# Patient Record
Sex: Male | Born: 1985 | Hispanic: Yes | Marital: Married | Smoking: Never smoker
Health system: Southern US, Community
[De-identification: ages and names within clinical notes are randomized; demographics above are authoritative.]

## PROBLEM LIST (undated history)

## (undated) DIAGNOSIS — K219 Gastro-esophageal reflux disease without esophagitis: Secondary | ICD-10-CM

## (undated) DIAGNOSIS — R748 Abnormal levels of other serum enzymes: Secondary | ICD-10-CM

## (undated) HISTORY — PX: APPENDECTOMY: SHX54

---

## 2008-05-23 ENCOUNTER — Inpatient Hospital Stay: Payer: Self-pay | Admitting: Vascular Surgery

## 2010-06-09 ENCOUNTER — Ambulatory Visit: Payer: Self-pay | Admitting: Internal Medicine

## 2011-01-07 ENCOUNTER — Ambulatory Visit: Payer: Self-pay | Admitting: Internal Medicine

## 2012-03-31 ENCOUNTER — Emergency Department: Payer: Self-pay | Admitting: Emergency Medicine

## 2015-01-04 ENCOUNTER — Emergency Department: Payer: BLUE CROSS/BLUE SHIELD

## 2015-01-04 ENCOUNTER — Emergency Department
Admission: EM | Admit: 2015-01-04 | Discharge: 2015-01-04 | Disposition: A | Discharge status: Home / Self Care | Payer: BLUE CROSS/BLUE SHIELD | Attending: Emergency Medicine | Admitting: Emergency Medicine

## 2015-01-04 ENCOUNTER — Encounter: Payer: Self-pay | Admitting: Emergency Medicine

## 2015-01-04 DIAGNOSIS — Y998 Other external cause status: Secondary | ICD-10-CM | POA: Insufficient documentation

## 2015-01-04 DIAGNOSIS — R4020 Unspecified coma: Secondary | ICD-10-CM | POA: Diagnosis present

## 2015-01-04 DIAGNOSIS — Y9389 Activity, other specified: Secondary | ICD-10-CM | POA: Diagnosis not present

## 2015-01-04 DIAGNOSIS — Y92096 Garden or yard of other non-institutional residence as the place of occurrence of the external cause: Secondary | ICD-10-CM | POA: Insufficient documentation

## 2015-01-04 DIAGNOSIS — S0990XA Unspecified injury of head, initial encounter: Secondary | ICD-10-CM | POA: Insufficient documentation

## 2015-01-04 DIAGNOSIS — M546 Pain in thoracic spine: Secondary | ICD-10-CM

## 2015-01-04 DIAGNOSIS — S29092A Other injury of muscle and tendon of back wall of thorax, initial encounter: Secondary | ICD-10-CM | POA: Diagnosis not present

## 2015-01-04 DIAGNOSIS — W19XXXA Unspecified fall, initial encounter: Secondary | ICD-10-CM

## 2015-01-04 DIAGNOSIS — F10120 Alcohol abuse with intoxication, uncomplicated: Secondary | ICD-10-CM | POA: Insufficient documentation

## 2015-01-04 DIAGNOSIS — W1839XA Other fall on same level, initial encounter: Secondary | ICD-10-CM | POA: Diagnosis not present

## 2015-01-04 DIAGNOSIS — F10920 Alcohol use, unspecified with intoxication, uncomplicated: Secondary | ICD-10-CM

## 2015-01-04 LAB — CBC WITH DIFFERENTIAL/PLATELET
BASOS ABS: 0 10*3/uL (ref 0–0.1)
EOS ABS: 3 10*3/uL — AB (ref 0–0.7)
Eosinophils Relative: 28 %
HEMATOCRIT: 48.2 % (ref 40.0–52.0)
Hemoglobin: 16.8 g/dL (ref 13.0–18.0)
Lymphocytes Relative: 27 %
Lymphs Abs: 2.9 10*3/uL (ref 1.0–3.6)
MCH: 29 pg (ref 26.0–34.0)
MCHC: 34.8 g/dL (ref 32.0–36.0)
MCV: 83.5 fL (ref 80.0–100.0)
MONO ABS: 0.5 10*3/uL (ref 0.2–1.0)
NEUTROS ABS: 4.4 10*3/uL (ref 1.4–6.5)
Neutrophils Relative %: 41 %
Platelets: 205 10*3/uL (ref 150–440)
RBC: 5.78 MIL/uL (ref 4.40–5.90)
RDW: 14.2 % (ref 11.5–14.5)
WBC: 10.9 10*3/uL — ABNORMAL HIGH (ref 3.8–10.6)

## 2015-01-04 LAB — COMPREHENSIVE METABOLIC PANEL
ALK PHOS: 69 U/L (ref 38–126)
ALT: 50 U/L (ref 17–63)
ANION GAP: 11 (ref 5–15)
AST: 27 U/L (ref 15–41)
Albumin: 4.8 g/dL (ref 3.5–5.0)
BUN: 15 mg/dL (ref 6–20)
CHLORIDE: 108 mmol/L (ref 101–111)
CO2: 23 mmol/L (ref 22–32)
CREATININE: 0.91 mg/dL (ref 0.61–1.24)
Calcium: 9.4 mg/dL (ref 8.9–10.3)
GFR calc Af Amer: >60 mL/min (ref >60)
Glucose, Bld: 116 mg/dL — ABNORMAL HIGH (ref 65–99)
Potassium: 3.6 mmol/L (ref 3.5–5.1)
Sodium: 142 mmol/L (ref 135–145)
Total Bilirubin: 0.7 mg/dL (ref 0.3–1.2)
Total Protein: 8.2 g/dL — ABNORMAL HIGH (ref 6.5–8.1)

## 2015-01-04 LAB — URINE DRUG SCREEN, QUALITATIVE (ARMC ONLY)
Amphetamines, Ur Screen: NOT DETECTED
BARBITURATES, UR SCREEN: NOT DETECTED
BENZODIAZEPINE, UR SCRN: NOT DETECTED
COCAINE METABOLITE, UR ~~LOC~~: NOT DETECTED
Cannabinoid 50 Ng, Ur ~~LOC~~: NOT DETECTED
MDMA (ECSTASY) UR SCREEN: NOT DETECTED
Methadone Scn, Ur: NOT DETECTED
Opiate, Ur Screen: NOT DETECTED
PHENCYCLIDINE (PCP) UR S: NOT DETECTED
TRICYCLIC, UR SCREEN: NOT DETECTED

## 2015-01-04 LAB — SALICYLATE LEVEL

## 2015-01-04 LAB — ETHANOL: Alcohol, Ethyl (B): 113 mg/dL — ABNORMAL HIGH (ref <5)

## 2015-01-04 LAB — ACETAMINOPHEN LEVEL: Acetaminophen (Tylenol), Serum: <10 ug/mL — ABNORMAL LOW (ref 10–30)

## 2015-01-04 MED ORDER — ONDANSETRON HCL 4 MG/2ML IJ SOLN
INTRAMUSCULAR | Status: AC
  Administered 2015-01-04: 4 mg via INTRAVENOUS
  Filled 2015-01-04: qty 2

## 2015-01-04 MED ORDER — ONDANSETRON HCL 4 MG PO TABS: 4.0000 mg | ORAL_TABLET | Freq: Three times a day (TID) | ORAL | Status: DC | PRN

## 2015-01-04 MED ORDER — ONDANSETRON HCL 4 MG/2ML IJ SOLN
4.0000 mg | Freq: Once | INTRAMUSCULAR | Status: AC
  Administered 2015-01-04: 4 mg via INTRAVENOUS

## 2015-01-04 MED ORDER — HYDROCODONE-ACETAMINOPHEN 5-325 MG PO TABS: 1.0000 | ORAL_TABLET | Freq: Four times a day (QID) | ORAL | Status: DC | PRN

## 2015-01-04 MED ORDER — SODIUM CHLORIDE 0.9 % IV BOLUS (SEPSIS)
1000.0000 mL | Freq: Once | INTRAVENOUS | Status: AC
  Administered 2015-01-04: 1000 mL via INTRAVENOUS

## 2015-01-04 MED ORDER — MORPHINE SULFATE 2 MG/ML IJ SOLN
INTRAMUSCULAR | Status: AC
  Administered 2015-01-04: 2 mg via INTRAVENOUS
  Filled 2015-01-04: qty 1

## 2015-01-04 MED ORDER — MORPHINE SULFATE 2 MG/ML IJ SOLN
2.0000 mg | Freq: Once | INTRAMUSCULAR | Status: AC
  Administered 2015-01-04: 2 mg via INTRAVENOUS

## 2015-01-04 MED ORDER — IBUPROFEN 800 MG PO TABS: 800.0000 mg | ORAL_TABLET | Freq: Three times a day (TID) | ORAL | Status: DC | PRN

## 2015-01-04 NOTE — ED Provider Notes (Signed)
[facility] Emergency Department Provider Note  ____________________________________________  Time seen: Approximately 3:42 AM  I have reviewed the triage vital signs and the nursing notes.   HISTORY  Chief Complaint Unresponsive  History limited by patient unresponsive  HPI Andre Ferrell is a 29 y.o. male who presents to the ED via EMS for reported EtOH intoxication and fall. Family states he was drinking heavily tonight; patient was found outside in the yard unresponsive. Family thinks he had been out there for approximately 40 minutes. Patient arrives in c-collar and on long backboard.Further history unable to be obtained from patient.   History reviewed. No pertinent past medical history.  There are no active problems to display for this patient.   Past Surgical History  Procedure Laterality Date  . Appendectomy      No current outpatient prescriptions on file.  Allergies Review of patient's allergies indicates no known allergies.  History reviewed. No pertinent family history.  Social History History  Substance Use Topics  . Smoking status: Never Smoker   . Smokeless tobacco: Never Used  . Alcohol Use: Yes    Review of Systems Constitutional: No fever/chills Eyes: No visual changes. ENT: No sore throat. Cardiovascular: Denies chest pain. Respiratory: Denies shortness of breath. Gastrointestinal: No abdominal pain.  No nausea, no vomiting.  No diarrhea.  No constipation. Genitourinary: Negative for dysuria. Musculoskeletal: Negative for back pain. Skin: Negative for rash. Neurological: Negative for headaches, focal weakness or numbness.  ROS limited by patient unresponsive. 10-point ROS otherwise negative.  ____________________________________________   PHYSICAL EXAM:  VITAL SIGNS: ED Triage Vitals  Enc Vitals Group     BP --      Pulse --      Resp --      Temp --      Temp src --      SpO2 --      Weight --    Height --      Head Cir --      Peak Flow --      Pain Score --      Pain Loc --      Pain Edu? --      Excl. in GC? --     Constitutional: Unresponsive Eyes: Conjunctivae are bloodshot. PERRL 5 mm bilaterally. EOMI. Blinking eyes to stimulation. Head: Atraumatic. Nose: No congestion/rhinnorhea. Mouth/Throat: Mucous membranes are moist.  Oropharynx non-erythematous. Neck: No stridor. No cervical spine step-offs or deformities palpated. Midline trachea. Cardiovascular: Normal rate, regular rhythm. Grossly normal heart sounds.  Good peripheral circulation. Respiratory: Normal respiratory effort.  No retractions. Lungs CTAB. Gastrointestinal: Soft and nontender. No distention. No abdominal bruits. No CVA tenderness. Musculoskeletal: No external evidence of injury to limbs. Neurologic:  Unresponsive. Blinks eyes to painful stimuli. Skin:  Skin is wet, cold and intact. No rash noted. Psychiatric: Mood and affect are normal. Speech and behavior are normal.  ____________________________________________   LABS (all labs ordered are listed, but only abnormal results are displayed)  Labs Reviewed  CBC WITH DIFFERENTIAL/PLATELET - Abnormal; Notable for the following:    WBC 10.9 (*)    Eosinophils Absolute 3.0 (*)    All other components within normal limits  COMPREHENSIVE METABOLIC PANEL - Abnormal; Notable for the following:    Glucose, Bld 116 (*)    Total Protein 8.2 (*)    All other components within normal limits  ETHANOL - Abnormal; Notable for the following:    Alcohol, Ethyl (B) 113 (*)  All other components within normal limits  ACETAMINOPHEN LEVEL - Abnormal; Notable for the following:    Acetaminophen (Tylenol), Serum <10 (*)    All other components within normal limits  SALICYLATE LEVEL  URINE DRUG SCREEN, QUALITATIVE ([facility] ONLY)   ____________________________________________  EKG  none ____________________________________________  RADIOLOGY  CT Head and  Cervical spine w/p contrast interpreted per Dr. Clovis Riley: 1. Negative for acute intracranial traumatic injury. Normal brain. 2. Negative for acute cervical spine fracture.  Thoracic spine 2 view (view by me, interpreted by Dr. Clovis Riley): Negative. ____________________________________________   PROCEDURES  Procedure(s) performed: None  Critical Care performed: No  ____________________________________________   INITIAL IMPRESSION / ASSESSMENT AND PLAN / ED COURSE  Pertinent labs & imaging results that were available during my care of the patient were reviewed by me and considered in my medical decision making (see chart for details).  29 year old male found unresponsive by family members in yard. Reportedly heavy EtOH this evening. Clothes soaked from the rain. Clothes were immediately removed and patient wrapped in warm blankets. Plan for IV fluid resuscitation, check screening labs including EtOH and urine drug screen, will obtain CT head and cervical spine.  ----------------------------------------- 4:57 AM on 01/04/2015 -----------------------------------------  Patient now awake, alert and talking. Admits to multiple tequila shots and he is usually a nondrinker. Complains of upper back pain. Cervical collar removed. Will obtain thoracic spine xrays.  ----------------------------------------- 5:53 AM on 01/04/2015 -----------------------------------------  Patient is feeling better. He is awake, alert, oriented. Updated patient and family of thoracic spine x-ray results. Patient is tolerating PO. Plan for NSAIDs and analgesia and follow-up with his PCP next week. Strict return precautions given. All verbalize understanding and agree with plan of care. ____________________________________________   FINAL CLINICAL IMPRESSION(S) / ED DIAGNOSES  Final diagnoses:  Alcohol intoxication, uncomplicated  Fall, initial encounter  Head injury, initial encounter  Bilateral  thoracic back pain      Irean Hong, MD 01/04/15 [phone]

## 2015-01-04 NOTE — ED Notes (Signed)
Pt taken to CT.

## 2015-01-04 NOTE — Discharge Instructions (Signed)
1. Take medicines as needed for pain and nausea (Motrin/Norco/Zofran #15). 2. Drink plenty of fluids this weekend. 3. Return to the ER for worsened symptoms, persistent vomiting, lethargy or other concerns.  Intoxicacin alcohlica (Alcohol Intoxication) La intoxicacin alcohlica se produce cuando la cantidad de alcohol que se ha consumido daa la capacidad de funcionamiento mental y fsico. El alcohol deteriora directamente la actividad qumica normal del cerebro. Beber grandes cantidades de alcohol puede conducir a Andre Ferrell funcionamiento mental y en el comportamiento, y puede causar muchos efectos fsicos que pueden ser perjudiciales.  La intoxicacin alcohlica puede variar en gravedad desde leve hasta muy grave. Hay varios factores que pueden afectar el nivel de intoxicacin que se produce, como la edad de la persona, el sexo, el peso, la frecuencia de consumo de alcohol, y la presencia de otras enfermedades mdicas (como diabetes, convulsiones o enfermedades del corazn). Los niveles peligrosos de intoxicacin por alcohol pueden ocurrir Kerr[employer]Mc[employer]ee personas beben grandes cantidades de alcohol en un corto periodo de tiempo (Andre Ferrell). El alcohol tambin puede ser especialmente peligroso cuando se combina con ciertos medicamentos recetados o drogas "recreativas". SIGNOS Y SNTOMAS Algunos de los signos y sntomas comunes de intoxicacin leve por alcohol incluyen:  Prdida de la coordinacin.  Cambios en el estado de nimo y la conducta.  Incapacidad para razonar.  Hablar arrastrando las palabras. A medida que la intoxicacin por alcohol avanza a niveles ms graves, Andre Ferrell a Research officer, trade union otros signos y sntomas. Estos pueden ser:  Vmitos.  Confusin y alteracin de Andre Ferrell, Andre (histocompatibility and immunogenetics).  Disminucin de Andre Ferrell, Andre Ferrell.  Convulsiones.  Prdida de la conciencia. DIAGNSTICO  El mdico le har una historia clnica y un examen fsico. Se le preguntar acerca de la cantidad y el tipo  de alcohol que ha consumido. Se le realizarn anlisis de sangre para medir la concentracin de alcohol en sangre. En muchos lugares, el nivel de alcohol en la sangre debe ser inferior a 80 mg / dL (8,65%) para poder conducir legalmente. Sin embargo, hay muchos efectos peligrosos del alcohol que pueden ocurrir con niveles mucho ms bajos.  TRATAMIENTO  Las personas con intoxicacin por alcohol a menudo no requieren TEFL teacher. La mayor parte de los efectos de la intoxicacin por alcohol son temporales, y desaparecen a medida que el alcohol abandona el cuerpo de forma natural. El profesional controlar su estado hasta que est lo suficientemente estable como para volver a casa. A veces se administran lquidos por va intravenosa para ayudar a evitar la deshidratacin.  INSTRUCCIONES PARA EL CUIDADO EN EL HOGAR  No conduzca vehculos despus de beber alcohol.  Mantngase hidratado. Beba gran cantidad de lquido para mantener la orina de tono claro o color amarillo plido. Evite la cafena.   Tome slo medicamentos de venta libre o recetados, segn las indicaciones del mdico.  SOLICITE ATENCIN MDICA SI:   Tiene vmitos persistentes.   No mejora luego de [employer].  Se intoxica con alcohol con frecuencia. El mdico podr ayudarlo a decidir si debe consultar a un terapeuta especializado en el abuso de sustancias. SOLICITE ATENCIN MDICA DE INMEDIATO SI:   Se siente vacilante o tembloroso cuando trata de abandonar el hbito.   Comienza a temblar de manera incontrolable (convulsiones).   Vomita sangre. Puede ser sangre de color rojo brillante o similar al sedimento del caf negro.   Andre Ferrell en la materia fecal. Puede ser de color rojo brillante o de aspecto alquitranado, con olor ftido.   Se siente mareado o se desmaya.  ASEGRESE DE QUE:   Comprende estas instrucciones.  Controlar su afeccin.  Recibir ayuda de inmediato si no mejora o si empeora. Document  Released: 06/20/2005 Document Revised: 02/20/2013 [facility] Patient Information 2015 [place], [place]. This information is not intended to replace advice given to you by your health care provider. Make sure you discuss any questions you have with your health care provider.  Prevencin de las cadas y seguridad en Advice worker  (Fall Prevention and Financial risk analyst) Las cadas causan lesiones y Futures trader a personas de todas las edades. Es posible utilizar medidas preventivas para disminuir significativamente la probabilidad de cadas. Hay medidas simples que pueden hacer de su hogar un lugar seguro y Automotive Andre Ferrell las cadas.  AL AIRE LIBRE   Repare las grietas y los bordes de aceras y Theme park manager.  Retire los Johnson [employer].  Recorte los arbustos en el camino principal.  Coloque una buena iluminacin en el exterior.  Elimine las herramientas, piedras y escombros de los senderos.  Compruebe que los pasamanos no se rompan y estn bien sujetos. Debe haber pasamanos a ambos lados de las escaleras .  Limpie regularmente hojas, nieve y hielo.  Utilice arena o sal en los pasillos durante los meses de invierno.  En el garaje, limpiar la grasa o los derrames de combustible. EN EL BAO   Instale luces de noche.  Instale barras de apoyo en el inodoro, en la baera y en la ducha.  Utilice alfombras o calcomanas antideslizantes en la baera o ducha.  Coloque un taburete de plstico antideslizante en la ducha para sentarse, si es necesario.  Mantenga los pisos limpios y seque toda el agua del suelo inmediatamente.  Elimine regularmente la acumulacin de jabn en la baera o la ducha.  Asegure las alfombras de bao con una cinta para alfombra doble faz.  Retire las alfombras y todo lo que pueda ser un un riesgo. HABITACIONES   Instale luces de noche.  Asegrese de que la luz de la mesita sea de fcil [place].  No utilice ropa de cama de gran tamao.  Mantenga un telfono junto a su cama.  Tenga  una silla firme con apoyabrazos para usar cuando se viste.  Retire las alfombras y lo que pueda ser un riesgo de cadas. COCINA   Mantenga las manijas de las ollas y sartenes hacia el centro del horno. Use los quemadores de atrs siempre que sea posible.  Limpie los derrames rpidamente y de tiempo para el secado.  Evite caminar sobre pisos mojados.  Evite utensilios y cuchillos calientes .  Cambie de posicin los estantes para que no sean demasiado altos o bajos.  Coloque los objetos de uso comn en lugares de fcil acceso.  Si es necesario, use una escalera firme con una barra de apoyo.  Mantenga los cables elctricos fuera del camino.  No use cera para pisos o limpiadores que dejen los pisos resbaladizos. Si tiene que usar cera, utilice cera antideslizante.  Retire del piso las alfombras y todo lo que pueda ser un un riesgo. ESCALERAS   Nunca deje objetos en las escaleras.  Coloque pasamanos a ambos lados de las escaleras y selos. Arregle los pasamanos sueltos. Asegrese que los pasamanos en ambos lados de las escaleras sean tan largos como las escaleras.  Verifique que la alfombra est bien asegurada a las escaleras. Repare rpidamente las alfombras desgastadas o sueltas .  Evite colocar alfombras en la parte superior o inferior de las escaleras, o asegure correctamente la alfombra con [place] Elders  para alfombras para evitar el deslizamiento. Deshgase de alfombras, si es posible.  Pdale a un electricista que coloque un interruptor de la luz en la parte superior e inferior de las escaleras. OTROS CONSEJOS PARA LA PREVENCIN DE CADAS   Use zapatos de tacn bajo o con suela de goma que sostengan y [place]. Use zapatos cerrados.  Al utilizar una escalera de [place], asegrese de que est completamente abierta y [address] travesaos firmemente bloqueados. No suba a una escalera de mano estando cerrada. .  Aada pintura de contraste o cinta de colores a las barras de  apoyo y Investment banker, operational en su casa. Coloque las tiras de contraste de Higher education careers adviser y el ltimo escaln.  Conozca y use los dispositivos de ayuda para la movilidad, segn sea necesario. Instalar un sistema de respuestas de Consulting civil Andre Ferrell.  Prenda las luces para evitar las reas oscuras. Reponga inmediatamente las lamparillas elctricas que se hayan quemado. Coloque interruptores de luz luminiscentes.  Disponga los muebles de tal modo que pueda crear caminos despejados. Deje los muebles siempre en Designer, jewellery.  Asegure firmemente las alfombras con cinta adhesiva de doble faz.  Elimine las superficies desparejas en los pisos.  Seleccione un diseo de alfombra que visualmente no oculte los bordes de las alfombras.  Tenga cuidado con las [employer] OTROS CONSEJOS DE SEGURIDAD PARA EL HOGAR   Seleccione una temperatura de 120 F (48,8 C) para el agua.  Tenga los nmeros de emergencia cerca del telfono.  Coloque detectores de humo en cada nivel de su casa y cerca de los dormitorios. Document Released: 09/27/2007 Document Revised: 12/20/2011 [facility] Patient Information 2015 [place], [place]. This information is not intended to replace advice given to you by your health care provider. Make sure you discuss any questions you have with your health care provider.  Lesin en la cabeza (Head Injury) Ha sufrido una lesin en la cabeza. En este momento no parece ser de gravedad. Los dolores de [place] y los vmitos son frecuentes luego de este tipo de lesiones. Si se duerme, debera resultar fcil despertarlo. A veces, es necesario que permanezca en la sala de emergencia durante un tiempo para su observacin. Tambin puede ser necesario hospitalizarlo. Despus de lesiones como la que usted sufri, la mayora de los problemas ocurre dentro de las primeras 24horas, pero los efectos secundarios pueden aparecer entre 7 y 10das despus de la lesin. Es importante que controle cuidadosamente su  problema y que se comunique con su mdico o busque atencin mdica de inmediato si observa algn cambio en su estado. CULES SON LOS TIPOS DE LESIONES EN LA CABEZA? Las lesiones en la cabeza pueden ser leves y provocar un bulto. Algunas lesiones en la cabeza pueden ser ms graves. Algunas de las lesiones graves en la cabeza son:  Andre Ferrell en el cerebro (conmocin).  Hematoma en el cerebro (contusin). Esto significa que hay hemorragia en el cerebro que puede causar un edema.  Fisura en el crneo (fractura de crneo).  Hemorragia en el cerebro que junta sangre, coagula y forma un bulto (hematoma). CULES SON LAS CAUSAS DE UNA LESIN EN LA CABEZA? Es ms probable que una lesin en la cabeza grave le ocurra a alguien que sufre un accidente automovilstico y no est usando el cinturn de seguridad. Otras causas de lesiones importantes en la cabeza incluyen accidentes en bicicleta o motocicleta, lesiones deportivas y cadas. CMO SE DIAGNOSTICAN LAS LESIONES EN LA CABEZA? Un historial completo del evento que deriv en la  lesin y sus sntomas actuales sern tiles para el diagnstico de lesiones en la cabeza. Muchas veces, se necesitar tomar imgenes del cerebro, como tomografa computarizada o resonancia magntica, para conocer la magnitud de la lesin. A menudo se debe pasar una noche entera en el hospital para observacin.  CUNDO DEBO BUSCAR ASISTENCIA MDICA INMEDIATA?  Debe obtener ayuda de inmediato en los siguientes casos:  Est confundido o somnoliento.  Siente Programme researcher, broadcasting/film/video (nuseas) o tiene vmitos constantes y forzosos.  Nota que los mareos o la inestabilidad empeoran.  Siente dolores de [place] intensos y persistentes que no se Copy con los medicamentos. Utilice los medicamentos de venta libre o recetados para Primary school teacher, el malestar o la fiebre, segn se lo indique el mdico.  Las piernas o los brazos no funcionan normalmente o no Hydrographic Ferrell.  Observa  cambios en los puntos negros en el centro de la parte de color del ojo (pupila).  Presenta una secrecin clara o con sangre que proviene de la nariz o de los odos.  Sufre prdida de la visin. Durante las prximas 24horas posteriores a la lesin, Office manager con Designer, industrial/product persona que pueda cuidarlo y estar atento a los signos de Customer service manager. Esta persona debe comunicarse con el servicio de emergencias de su localidad (911 en los [place] si usted tiene convulsiones, est inconsciente o no puede despertarse. CMO PUEDO PREVENIR UNA LESIN EN LA CABEZA EN EL FUTURO? El factor ms importante para prevenir lesiones en la cabeza de gravedad es evitar los accidentes en vehculos a motor. Para minimizar el dao potencial a la cabeza, es crucial usar cinturones de seguridad. Tambin es til usar casco si anda en bicicleta y Therapist, occupational deportes de contacto (como el ftbol Public house manager). Adems, evite las actividades peligrosas en su casa para ayudar a reducir el riesgo de sufrir una lesin en la cabeza.  CUNDO PUEDO RETOMAR LAS ACTIVIDADES NORMALES Y EL ATLETISMO? Antes de retomar estas actividades, su mdico debe volver a evaluarlo. Si presenta alguno de los siguientes sntomas, no podr retomar sus actividades ni volver a Microbiologist de contacto hasta una semana despus de que los sntomas hayan desaparecido:  Dolor de cabeza persistente.  Mareos o vrtigo.  Falta de atencin y Librarian, academic.  Confusin.  Problemas de memoria.  Nuseas o vmitos.  Siente fatiga o se cansa fcilmente.  Irritabilidad.  Intolerancia a la luz brillante y a los ruidos fuertes.  Ansiedad o depresin.  Trastornos del sueo ASEGRESE DE QUE:   Comprende estas instrucciones.  Controlar su afeccin.  Recibir ayuda de inmediato si no mejora o si empeora. Document Released: 06/20/2005 Document Revised: 06/25/2013 [facility] Patient Information 2015 [place], [place]. This information is not intended to  replace advice given to you by your health care provider. Make sure you discuss any questions you have with your health care provider.  Distensin Torcica Producer, television/film/video) Usted se ha daado los msculos o tendones que estn unidos a la parte superior de la espalda, por detrs del pecho. Esta lesin se denomina distensin torcica, esguince torcico o distensin de la espalda media.  CAUSAS Puede tener diferentes causas. Una lesin menos grave se produce cuando un msculo se distiende pero no se rompe. Una lesin ms grave es la(ruptura) de un msculo o un tendn. En lesiones menos graves podr existir alguna prdida de fuerza. A veces puede haber fracturas de los huesos a los cuales estn [employer]. Estas fracturas son raras, excepto cuando hubo un golpe directo (traumatismo) o los huesos estn  dbiles debido a la osteoporosis o a la edad. La causa de los esguinces crnicos puede ser el uso excesivo o inadecuado durante ciertos movimientos. La obesidad tambin aumenta el riesgo de lesiones en la espalda. Los esguinces agudos pueden producirse por una lesin o por falta de precalentamiento adecuado antes de hacer ejercicios. En general, no hay causas evidentes para el esguince torcico. SNTOMAS El sntoma principal es el dolor, especialmente con los movimientos, por ejemplo cuando se hacen ejercicios.  DIAGNSTICO Normalmente puede diagnosticarse con rayos-x y un examen fsico. TRATAMIENTO  La fisioterapia puede ser til para la recuperacin. El Firefightermdico le indicar ejercicios o lo derivar a un fisioterapeuta despus de que el dolor mejore.  Cuando el dolor se calme, deber realizar un programa de fortalecimiento y rehabilitacin adecuado para el tipo de deporte u ocupacin que Biomedical engineerrealiza.  Entre en calor antes de realizar deporte o actividad fsica. Tambin puede ser beneficioso que elongue despus de la actividad fsica.  Puede tomar medicamentos de H. J. Heinzventa libre. Consulte a su mdico qu  medicamentos puede tomar. Si esta es la primera vez que le sucede esta lesin, un cuidado adecuado y un tiempo de curacin suficiente antes de Games developercomenzar la actividad ayudar a prevenir una incapacidad de Air cabin crewlargo plazo. Los desgarros de ligamentos y tendones requieren tanto tiempo para curarse The First Americancomo los huesos rotos. El tiempos promedio de curacin puede ser de slo una semana para un esguince medio. Para desgarros de msculos y tendones, el tiempo promedio de curacin puede ser desde 6 semanas a 2 meses. INSTRUCCIONES PARA EL CUIDADO DOMICILIARIO  Aplique hielo sobre la zona lesionada. Puede hacerse masajes con hielo segn las indicaciones.  Ponga el hielo en una bolsa plstica.  Coloque una toalla entre la piel y la bolsa de hielo.  Deje la bolsa de hielo durante 15 a 20 minutos 3 a 4 veces por da, durante los 2 Entergy Corporationprimeros das.  Utilice los medicamentos de venta libre o de prescripcin para Chief Technology Officerel dolor, Environmental health practitionerel malestar o la Cr[place] Lakesfiebre, segn se lo indique el profesional que lo asiste.  Concurra a las consultas de fisioterapia si se le han prescrito.  Use vendas y aparatos ortopdicos para la espalda segn las instrucciones. SOLICITE ATENCIN MDICA DE INMEDIATO SI:  Aumenta el hematoma, la hinchazn o el dolor.  No consigue Andre Ferrell, materialsaliviar el dolor con la medicacin.  Comienza a Financial risk analystsentir que le falta el aire, siente dolor en el pecho o le sube la Mabletonfiebre.  El problema parece empeorar ms que mejorar. ASEGRESE DE QUE:   Comprende estas instrucciones.  Controlar su enfermedad.  Solicitar ayuda de inmediato si no mejora o empeora. Document Released: 09/27/2007 Document Revised: 09/12/2011 [facility] Patient Information 2015 [place]. This information is not intended to replace advice given to you by your health care provider. Make sure you discuss any questions you have with your health care provider.

## 2015-01-04 NOTE — ED Notes (Signed)
Pt's family reports pt fell down 5 steps, hurting his back.  After fall pt was conscious but went unconscious shortly after.  Family reports pt had 5 shots of liquor and 2 beers and pt is not a frequent drinker.  Family reports pt was in rain about 30 min.  Conversation with family facilitated by Damian Leavellrudy, interpreter.

## 2015-01-04 NOTE — ED Notes (Signed)
Pt to rm 26 via EMS. EMS report pt ETOH intoxication and fall.  Pt was unresponsive upon EMS arrival.  Pt minimally responsive upon arrival to ED.  Pt in c-collar and back board.  Dr. Dolores FrameSung at bedside.  Pt blinking eyes to stimulation.  EMS reports reflexes intact.  EMS report VS stable en route.

## 2015-01-04 NOTE — ED Notes (Signed)
Pt started hyperventilating after med administration. o2 via NRB applied and pt instructed to slow his breathing.

## 2015-11-11 ENCOUNTER — Ambulatory Visit
Admission: RE | Admit: 2015-11-11 | Discharge: 2015-11-11 | Disposition: A | Discharge status: Home / Self Care | Payer: BLUE CROSS/BLUE SHIELD | Source: Ambulatory Visit | Attending: Family Medicine | Admitting: Family Medicine

## 2015-11-11 ENCOUNTER — Other Ambulatory Visit: Payer: Self-pay | Admitting: Family Medicine

## 2015-11-11 DIAGNOSIS — R1032 Left lower quadrant pain: Secondary | ICD-10-CM

## 2015-11-11 DIAGNOSIS — K4020 Bilateral inguinal hernia, without obstruction or gangrene, not specified as recurrent: Secondary | ICD-10-CM | POA: Insufficient documentation

## 2015-11-11 MED ORDER — IOPAMIDOL (ISOVUE-300) INJECTION 61%
100.0000 mL | Freq: Once | INTRAVENOUS | Status: AC | PRN
  Administered 2015-11-11: 100 mL via INTRAVENOUS

## 2016-02-05 ENCOUNTER — Encounter
Admission: RE | Admit: 2016-02-05 | Discharge: 2016-02-05 | Disposition: A | Discharge status: Home / Self Care | Payer: BLUE CROSS/BLUE SHIELD | Source: Ambulatory Visit | Attending: Surgery | Admitting: Surgery

## 2016-02-05 HISTORY — DX: Gastro-esophageal reflux disease without esophagitis: K21.9

## 2016-02-05 NOTE — Patient Instructions (Signed)
  Your procedure is scheduled on: 02-12-16 Report to Same Day Surgery 2nd floor medical mall To find out your arrival time please call [phone] between 1PM - 3PM on 02-11-16  Remember: Instructions that are not followed completely may result in serious medical risk, up to and including death, or upon the discretion of your surgeon and anesthesiologist your surgery may need to be rescheduled.    _x___ 1. Do not eat food or drink liquids after midnight. No gum chewing or hard candies.     __x__ 2. No Alcohol for 24 hours before or after surgery.   __x__3. No Smoking for 24 prior to surgery.   ____  4. Bring all medications with you on the day of surgery if instructed.    __x__ 5. Notify your doctor if there is any change in your medical condition     (cold, fever, infections).     Do not wear jewelry, make-up, hairpins, clips or nail polish.  Do not wear lotions, powders, or perfumes. You may wear deodorant.  Do not shave 48 hours prior to surgery. Men may shave face and neck.  Do not bring valuables to the hospital.    [facility] is not responsible for any belongings or valuables.               Contacts, dentures or bridgework may not be worn into surgery.  Leave your suitcase in the car. After surgery it may be brought to your room.  For patients admitted to the hospital, discharge time is determined by your treatment team.   Patients discharged the day of surgery will not be allowed to drive home.    Please read over the following fact sheets that you were given:   [facility] Preparing for Surgery and or MRSA Information   _x___ Take these medicines the morning of surgery with A SIP OF WATER:    1. NONE  2.  3.  4.  5.  6.  ____ Fleet Enema (as directed)   ____ Use CHG Soap or sage wipes as directed on instruction sheet   ____ Use inhalers on the day of surgery and bring to hospital day of surgery  ____ Stop metformin 2 days prior to surgery    ____ Take 1/2 of  usual insulin dose the night before surgery and none on the morning of           surgery.   ____ Stop aspirin or coumadin, or plavix  _x__ Stop Anti-inflammatories such as Advil, Aleve, Ibuprofen, Motrin, Naproxen,          Naprosyn, Goodies powders or aspirin products-NOW- Ok to take Tylenol.   ____ Stop supplements until after surgery.    ____ Bring C-Pap to the hospital.

## 2016-02-11 ENCOUNTER — Encounter: Payer: Self-pay | Admitting: *Deleted

## 2016-02-12 ENCOUNTER — Ambulatory Visit
Admission: RE | Admit: 2016-02-12 | Discharge: 2016-02-12 | Disposition: A | Discharge status: Home / Self Care | Payer: BLUE CROSS/BLUE SHIELD | Source: Ambulatory Visit | Attending: Surgery | Admitting: Surgery

## 2016-02-12 ENCOUNTER — Encounter
Admission: RE | Disposition: A | Discharge status: Home / Self Care | Payer: Self-pay | Source: Ambulatory Visit | Attending: Surgery

## 2016-02-12 ENCOUNTER — Ambulatory Visit: Payer: BLUE CROSS/BLUE SHIELD | Admitting: Anesthesiology

## 2016-02-12 DIAGNOSIS — K4020 Bilateral inguinal hernia, without obstruction or gangrene, not specified as recurrent: Secondary | ICD-10-CM | POA: Diagnosis not present

## 2016-02-12 DIAGNOSIS — K219 Gastro-esophageal reflux disease without esophagitis: Secondary | ICD-10-CM | POA: Insufficient documentation

## 2016-02-12 HISTORY — DX: Abnormal levels of other serum enzymes: R74.8

## 2016-02-12 HISTORY — PX: INGUINAL HERNIA REPAIR: SHX194

## 2016-02-12 SURGERY — REPAIR, HERNIA, INGUINAL, ADULT
Anesthesia: General | Laterality: Bilateral | Wound class: Clean

## 2016-02-12 MED ORDER — NEOSTIGMINE METHYLSULFATE 10 MG/10ML IV SOLN
INTRAVENOUS | Status: DC | PRN
  Administered 2016-02-12: 3 mg via INTRAVENOUS

## 2016-02-12 MED ORDER — HYDROCODONE-ACETAMINOPHEN 5-325 MG PO TABS: 1.0000 | ORAL_TABLET | ORAL | 0 refills | Status: AC | PRN

## 2016-02-12 MED ORDER — DEXAMETHASONE SODIUM PHOSPHATE 10 MG/ML IJ SOLN
INTRAMUSCULAR | Status: DC | PRN
  Administered 2016-02-12: 10 mg via INTRAVENOUS

## 2016-02-12 MED ORDER — MEPERIDINE HCL 25 MG/ML IJ SOLN: 6.2500 mg | INTRAMUSCULAR | Status: DC | PRN

## 2016-02-12 MED ORDER — FENTANYL CITRATE (PF) 100 MCG/2ML IJ SOLN
INTRAMUSCULAR | Status: AC
  Administered 2016-02-12: 50 ug via INTRAVENOUS
  Filled 2016-02-12: qty 2

## 2016-02-12 MED ORDER — CEFAZOLIN SODIUM-DEXTROSE 2-4 GM/100ML-% IV SOLN
INTRAVENOUS | Status: AC
  Administered 2016-02-12: 2 g via INTRAVENOUS
  Filled 2016-02-12: qty 100

## 2016-02-12 MED ORDER — ONDANSETRON HCL 4 MG/2ML IJ SOLN
INTRAMUSCULAR | Status: DC | PRN
  Administered 2016-02-12: 4 mg via INTRAVENOUS

## 2016-02-12 MED ORDER — LACTATED RINGERS IV SOLN
INTRAVENOUS | Status: DC
  Administered 2016-02-12: 07:00:00 via INTRAVENOUS

## 2016-02-12 MED ORDER — HYDROCODONE-ACETAMINOPHEN 5-325 MG PO TABS: 1.0000 | ORAL_TABLET | ORAL | Status: DC | PRN

## 2016-02-12 MED ORDER — OXYCODONE HCL 5 MG/5ML PO SOLN: 5.0000 mg | Freq: Once | ORAL | Status: AC | PRN

## 2016-02-12 MED ORDER — FAMOTIDINE 20 MG PO TABS
20.0000 mg | ORAL_TABLET | Freq: Once | ORAL | Status: AC
  Administered 2016-02-12: 20 mg via ORAL

## 2016-02-12 MED ORDER — MIDAZOLAM HCL 2 MG/2ML IJ SOLN
INTRAMUSCULAR | Status: DC | PRN
  Administered 2016-02-12: 2 mg via INTRAVENOUS

## 2016-02-12 MED ORDER — FENTANYL CITRATE (PF) 100 MCG/2ML IJ SOLN
25.0000 ug | INTRAMUSCULAR | Status: DC | PRN
  Administered 2016-02-12 (×3): 50 ug via INTRAVENOUS

## 2016-02-12 MED ORDER — PROMETHAZINE HCL 25 MG/ML IJ SOLN
6.2500 mg | INTRAMUSCULAR | Status: DC | PRN
  Administered 2016-02-12: 6.25 mg via INTRAVENOUS

## 2016-02-12 MED ORDER — FENTANYL CITRATE (PF) 100 MCG/2ML IJ SOLN
INTRAMUSCULAR | Status: DC | PRN
  Administered 2016-02-12: 100 ug via INTRAVENOUS

## 2016-02-12 MED ORDER — OXYCODONE HCL 5 MG PO TABS
5.0000 mg | ORAL_TABLET | Freq: Once | ORAL | Status: AC | PRN
  Administered 2016-02-12: 5 mg via ORAL

## 2016-02-12 MED ORDER — KETOROLAC TROMETHAMINE 30 MG/ML IJ SOLN
INTRAMUSCULAR | Status: AC
  Filled 2016-02-12: qty 1

## 2016-02-12 MED ORDER — PROMETHAZINE HCL 25 MG/ML IJ SOLN
INTRAMUSCULAR | Status: AC
  Filled 2016-02-12: qty 1

## 2016-02-12 MED ORDER — PROPOFOL 10 MG/ML IV BOLUS
INTRAVENOUS | Status: DC | PRN
  Administered 2016-02-12: 200 mg via INTRAVENOUS

## 2016-02-12 MED ORDER — OXYCODONE HCL 5 MG PO TABS
ORAL_TABLET | ORAL | Status: AC
  Administered 2016-02-12: 5 mg via ORAL
  Filled 2016-02-12: qty 1

## 2016-02-12 MED ORDER — HYDROMORPHONE HCL 1 MG/ML IJ SOLN
0.2500 mg | INTRAMUSCULAR | Status: DC | PRN
  Administered 2016-02-12 (×2): 0.5 mg via INTRAVENOUS

## 2016-02-12 MED ORDER — HYDROMORPHONE HCL 1 MG/ML IJ SOLN
INTRAMUSCULAR | Status: DC | PRN
  Administered 2016-02-12 (×2): .4 mg via INTRAVENOUS
  Administered 2016-02-12: .6 mg via INTRAVENOUS

## 2016-02-12 MED ORDER — PROMETHAZINE HCL 25 MG/ML IJ SOLN
INTRAMUSCULAR | Status: AC
  Administered 2016-02-12: 6.25 mg via INTRAVENOUS
  Filled 2016-02-12: qty 1

## 2016-02-12 MED ORDER — FAMOTIDINE 20 MG PO TABS
ORAL_TABLET | ORAL | Status: AC
  Administered 2016-02-12: 20 mg via ORAL
  Filled 2016-02-12: qty 1

## 2016-02-12 MED ORDER — GLYCOPYRROLATE 0.2 MG/ML IJ SOLN
INTRAMUSCULAR | Status: DC | PRN
  Administered 2016-02-12: .4 mg via INTRAVENOUS

## 2016-02-12 MED ORDER — BUPIVACAINE-EPINEPHRINE (PF) 0.5% -1:200000 IJ SOLN
INTRAMUSCULAR | Status: AC
  Filled 2016-02-12: qty 30

## 2016-02-12 MED ORDER — HYDROMORPHONE HCL 1 MG/ML IJ SOLN
INTRAMUSCULAR | Status: AC
  Filled 2016-02-12: qty 1

## 2016-02-12 MED ORDER — KETOROLAC TROMETHAMINE 30 MG/ML IJ SOLN
30.0000 mg | Freq: Once | INTRAMUSCULAR | Status: AC
  Administered 2016-02-12: 30 mg via INTRAVENOUS

## 2016-02-12 MED ORDER — BUPIVACAINE-EPINEPHRINE (PF) 0.5% -1:200000 IJ SOLN
INTRAMUSCULAR | Status: DC | PRN
  Administered 2016-02-12: 30 mL via PERINEURAL

## 2016-02-12 MED ORDER — SODIUM CHLORIDE 0.9 % IJ SOLN
INTRAMUSCULAR | Status: AC
  Filled 2016-02-12: qty 40

## 2016-02-12 MED ORDER — CEFAZOLIN SODIUM-DEXTROSE 2-4 GM/100ML-% IV SOLN
2.0000 g | Freq: Once | INTRAVENOUS | Status: AC
  Administered 2016-02-12: 2 g via INTRAVENOUS

## 2016-02-12 MED ORDER — LIDOCAINE HCL (CARDIAC) 20 MG/ML IV SOLN
INTRAVENOUS | Status: DC | PRN
  Administered 2016-02-12: 100 mg via INTRAVENOUS

## 2016-02-12 SURGICAL SUPPLY — 34 items
BLADE CLIPPER SURG (BLADE) ×3 IMPLANT
BLADE SURG 15 STRL LF DISP TIS (BLADE) ×1 IMPLANT
BLADE SURG 15 STRL SS (BLADE) ×2
CANISTER SUCT 1200ML W/VALVE (MISCELLANEOUS) ×3 IMPLANT
CHLORAPREP W/TINT 26ML (MISCELLANEOUS) ×3 IMPLANT
DRAIN PENROSE 5/8X18 LTX STRL (WOUND CARE) ×3 IMPLANT
DRAPE LAPAROTOMY 77X122 PED (DRAPES) ×3 IMPLANT
DRAPE LAPAROTOMY TRNSV 106X77 (MISCELLANEOUS) ×3 IMPLANT
ELECT REM PT RETURN 9FT ADLT (ELECTROSURGICAL) ×3
ELECTRODE REM PT RTRN 9FT ADLT (ELECTROSURGICAL) ×1 IMPLANT
GLOVE BIO SURGEON STRL SZ 6.5 (GLOVE) ×4 IMPLANT
GLOVE BIO SURGEON STRL SZ7.5 (GLOVE) ×3 IMPLANT
GLOVE BIO SURGEONS STRL SZ 6.5 (GLOVE) ×2
GLOVE BIOGEL PI IND STRL 7.0 (GLOVE) ×1 IMPLANT
GLOVE BIOGEL PI INDICATOR 7.0 (GLOVE) ×2
GOWN STRL REUS W/ TWL LRG LVL3 (GOWN DISPOSABLE) ×3 IMPLANT
GOWN STRL REUS W/TWL LRG LVL3 (GOWN DISPOSABLE) ×6
KIT RM TURNOVER STRD PROC AR (KITS) ×3 IMPLANT
LABEL OR SOLS (LABEL) IMPLANT
LIQUID BAND (GAUZE/BANDAGES/DRESSINGS) ×3 IMPLANT
MESH SYNTHETIC 4X6 SOFT BARD (Mesh General) ×1 IMPLANT
MESH SYNTHETIC SOFT BARD 4X6 (Mesh General) ×2 IMPLANT
NEEDLE HYPO 25X1 1.5 SAFETY (NEEDLE) ×3 IMPLANT
NS IRRIG 500ML POUR BTL (IV SOLUTION) ×3 IMPLANT
PACK BASIN MINOR ARMC (MISCELLANEOUS) ×3 IMPLANT
SUT CHROMIC 4 0 RB 1X27 (SUTURE) ×3 IMPLANT
SUT MNCRL 4-0 (SUTURE) ×2
SUT MNCRL 4-0 27XMFL (SUTURE) ×1
SUT MNCRL AB 4-0 PS2 18 (SUTURE) ×3 IMPLANT
SUT SURGILON 0 30 BLK (SUTURE) ×9 IMPLANT
SUT VIC AB 4-0 SH 27 (SUTURE) ×8
SUT VIC AB 4-0 SH 27XANBCTRL (SUTURE) ×4 IMPLANT
SUTURE MNCRL 4-0 27XMF (SUTURE) ×1 IMPLANT
SYRINGE 10CC LL (SYRINGE) ×3 IMPLANT

## 2016-02-12 NOTE — Op Note (Signed)
OPERATIVE REPORT  PREOPERATIVE DIAGNOSIS: bilateral inguinal hernia  POSTOPERATIVE DIAGNOSIS:bilateral  inguinal hernia  PROCEDURE:  bilateral inguinal hernia repair  ANESTHESIA:  General  SURGEON:  Renda Rolls M.D.  INDICATIONS: He has a history of bilateral groin pain and CT findings of fat-containing bilateral inguinal hernias. This was also demonstrated on physical exam. Surgery was recommended for definitive treatment.  With the patient on the operating table in the supine position the bilateral lower quadrant was prepared with clippers and with ChloraPrep and draped in a sterile manner. A transversely oriented right suprapubic incision was made and carried down through subcutaneous tissues. Electrocautery was used for hemostasis. The Scarpa's fascia was incised. The external oblique aponeurosis was incised along the course of its fibers to open the external ring and expose the inguinal cord structures. The cord structures were mobilized. A Penrose drain was passed around the cord structures for traction. Cremaster fibers were separated to expose an indirect hernia sac. The sac was some 3 inches in length and was dissected free from surrounding structures up into the internal ring. A high ligation of the sac was done with a 4-0 Vicryl suture ligature and was amputated. The stump was allowed to retract. Bard soft mesh was cut to create an oval shape and was placed over the repair. This was sutured to the repair with interrupted 0 Surgilon sutures and also sutured medially to the deep fascia and on both sides of the internal ring. Next after seeing hemostasis was intact the cord structures were replaced along the floor of the inguinal canal. The cut edges of the external oblique aponeurosis were closed with a running 4-0 Vicryl suture to re-create the external ring. The deep fascia superior and lateral to the repair site was infiltrated with half percent Sensorcaine with epinephrine. Subcutaneous  tissues were also infiltrated. The Scarpa's fascia was closed with interrupted 4-0 Vicryl sutures. The skin was closed with running 4-0 Monocryl subcuticular suture.  The patient appeared to be in satisfactory condition and subsequently began the operation on the left side. A transversely oriented left suprapubic incision was made and carried down through the subcutaneous tissues. Electrocautery was used for hemostasis. The Scarpa's fascia was incised. The external oblique aponeurosis was incised along the course of its fibers to open the external ring and expose the inguinal cord structures. The cord structures were mobilized. A Penrose drain was passed around the cord structures for traction. Cremaster fibers were separated to expose an indirect hernia sac. The sac was some 3 inches in length and was dissected free from surrounding structures up into the internal ring. A high ligation of the sac was done with a 4-0 Vicryl suture ligature and was amputated. The stump was allowed to retract. Bard soft mesh was cut to create an oval shape and was placed over the repair. This was sutured to the repair with interrupted 0 Surgilon sutures and also sutured medially to the deep fascia and on both sides of the internal ring. Next after seeing hemostasis was intact the cord structures were replaced along the floor of the inguinal canal. The cut edges of the external oblique aponeurosis were closed with a running 4-0 Vicryl suture to re-create the external ring. The deep fascia superior and lateral to the repair site was infiltrated with half percent Sensorcaine with epinephrine. Subcutaneous tissues were also infiltrated. The Scarpa's fascia was closed with interrupted 4-0 Vicryl sutures. The skin was closed with running 4-0 Monocryl subcuticular suture. Both wounds were treated with LiquiBand.  The patient appear to be in satisfactory condition and was then prepared for transfer to the recovery room   Renda RollsWilton Severus Ferrell  M.D.

## 2016-02-12 NOTE — Discharge Instructions (Signed)
AMBULATORY SURGERY  DISCHARGE INSTRUCTIONS   1) The drugs that you were given will stay in your system until tomorrow so for the next 24 hours you should not:  A) Drive an automobile B) Make any legal decisions C) Drink any alcoholic beverage   2) You may resume regular meals tomorrow.  Today it is better to start with liquids and gradually work up to solid foods.  You may eat anything you prefer, but it is better to start with liquids, then soup and crackers, and gradually work up to solid foods.   3) Please notify your doctor immediately if you have any unusual bleeding, trouble breathing, redness and pain at the surgery site, drainage, fever, or pain not relieved by medication.    4) Additional Instructions:        Please contact your physician with any problems or Same Day Surgery at [phone], Monday through Friday 6 am to 4 pm, or [place] at [facility] Main number at [phone].Take Tylenol or Norco if needed for pain.  Should not drive or do anything dangerous when taking Norco.  May shower and blot dry.  Avoid straining and heavy lifting.

## 2016-02-12 NOTE — Anesthesia Procedure Notes (Signed)
Procedure Name: Intubation Date/Time: 02/12/2016 7:49 AM Performed by: Almeta MonasFLETCHER, Tyniah Kastens Pre-anesthesia Checklist: Patient identified, Emergency Drugs available, Suction available and Patient being monitored Patient Re-evaluated:Patient Re-evaluated prior to inductionOxygen Delivery Method: Circle system utilized Preoxygenation: Pre-oxygenation with 100% oxygen Intubation Type: IV induction Ventilation: Mask ventilation without difficulty Laryngoscope Size: Mac and 3 Grade View: Grade I Tube type: Oral Laser Tube: Cuffed inflated with minimal occlusive pressure - saline Tube size: 7.0 mm Number of attempts: 1 Airway Equipment and Method: Patient positioned with wedge pillow and Stylet Placement Confirmation: ETT inserted through vocal cords under direct vision,  positive ETCO2 and breath sounds checked- equal and bilateral Secured at: 21 cm Tube secured with: Tape Dental Injury: Teeth and Oropharynx as per pre-operative assessment

## 2016-02-12 NOTE — Transfer of Care (Signed)
Immediate Anesthesia Transfer of Care Note  Patient: Andre Ferrell  Procedure(s) Performed: Procedure(s): HERNIA REPAIR INGUINAL ADULT (Bilateral)  Patient Location: PACU  Anesthesia Type:General  Level of Consciousness: sedated  Airway & Oxygen Therapy: Patient Spontanous Breathing and Patient connected to face mask oxygen  Post-op Assessment: Report given to RN and Post -op Vital signs reviewed and stable  Post vital signs: Reviewed and stable  Last Vitals:  Vitals:   02/12/16 0620 02/12/16 1005  BP: 138/82 (!) 141/82  Pulse: 69 93  Resp: 16 15  Temp: 36.6 C 36.2 C    Last Pain:  Vitals:   02/12/16 1005  TempSrc:   PainSc: Asleep         Complications: No apparent anesthesia complications

## 2016-02-12 NOTE — Anesthesia Postprocedure Evaluation (Signed)
Anesthesia Post Note  Patient: Andre Ferrell  Procedure(s) Performed: Procedure(s) (LRB): HERNIA REPAIR INGUINAL ADULT (Bilateral)  Patient location during evaluation: PACU Anesthesia Type: General Level of consciousness: awake and alert and oriented Pain management: pain level controlled Vital Signs Assessment: post-procedure vital signs reviewed and stable Respiratory status: spontaneous breathing, nonlabored ventilation and respiratory function stable Cardiovascular status: blood pressure returned to baseline and stable Postop Assessment: no signs of nausea or vomiting Anesthetic complications: no    Last Vitals:  Vitals:   02/12/16 1054 02/12/16 1057  BP: 119/79   Pulse: (!) 104 87  Resp: 20 12  Temp:      Last Pain:  Vitals:   02/12/16 1057  TempSrc:   PainSc: 7                  Safiyah Cisney

## 2016-02-12 NOTE — Addendum Note (Signed)
Addendum  created 02/12/16 1126 by Alver FisherAmy Emersyn Wyss, MD   Order sets accessed

## 2016-02-12 NOTE — Anesthesia Preprocedure Evaluation (Signed)
Anesthesia Evaluation  Patient identified by MRN, date of birth, ID band Patient awake    Reviewed: Allergy & Precautions, NPO status , Patient's Chart, lab work & pertinent test results  History of Anesthesia Complications Negative for: history of anesthetic complications  Airway Mallampati: III  TM Distance: >3 FB Neck ROM: Full    Dental no notable dental hx.    Pulmonary neg pulmonary ROS,    breath sounds clear to auscultation- rhonchi (-) wheezing      Cardiovascular Exercise Tolerance: Good (-) hypertension(-) CAD and (-) Past MI  Rhythm:Regular Rate:Normal - Systolic murmurs and - Diastolic murmurs    Neuro/Psych negative neurological ROS  negative psych ROS   GI/Hepatic Neg liver ROS, GERD  ,  Endo/Other  negative endocrine ROS  Renal/GU negative Renal ROS     Musculoskeletal negative musculoskeletal ROS (+)   Abdominal (+) - obese,   Peds  Hematology negative hematology ROS (+)   Anesthesia Other Findings   Reproductive/Obstetrics                             Anesthesia Physical Anesthesia Plan  ASA: I  Anesthesia Plan: General   Post-op Pain Management:    Induction:   Airway Management Planned: LMA  Additional Equipment:   Intra-op Plan:   Post-operative Plan:   Informed Consent: I have reviewed the patients History and Physical, chart, labs and discussed the procedure including the risks, benefits and alternatives for the proposed anesthesia with the patient or authorized representative who has indicated his/her understanding and acceptance.   Dental advisory given  Plan Discussed with: CRNA and Anesthesiologist  Anesthesia Plan Comments:         Anesthesia Quick Evaluation

## 2016-02-12 NOTE — H&P (Signed)
Andre Ferrell is an 30 y.o. male.   Chief Complaint: Groin pain HPI: He has a history of bilateral groin pain. This is been following him for about 1 year. Sometimes is related to strenuous activities. He has had CT scan of the abdomen which demonstrated bilateral fat-containing inguinal hernias.  Past Medical History:  Diagnosis Date  . Elevated liver enzymes   . GERD (gastroesophageal reflux disease)    NO MEDS    Past Surgical History:  Procedure Laterality Date  . APPENDECTOMY      History reviewed. No pertinent family history. Social History:  reports that he has never smoked. He has never used smokeless tobacco. He reports that he drinks alcohol. He reports that he does not use drugs.  Allergies: No Known Allergies  Medications Prior to Admission  Medication Sig Dispense Refill  . ibuprofen (ADVIL,MOTRIN) 200 MG tablet Take 200 mg by mouth every 6 (six) hours as needed.      No results found for this or any previous visit (from the past 48 hour(s)). No results found.  ROS he reports no change in overall condition since the day of the office visit. He has had some minor pain in his right shoulder. No difficulties breathing. No other recent acute illness.  Blood pressure 138/82, pulse 69, temperature 97.8 F (36.6 C), temperature source Tympanic, resp. rate 16, weight 95.3 kg (210 lb), SpO2 95 %. Physical Exam  GENERAL:  Awake alert and oriented and in no acute distress.  HEENT:  Head is normocephalic.  Pupils are equal reactive to light.  Extraocular movements are intact. Sclera is clear.  Pharynx is clear.  NECK:  Supple with no palpable mass and no adenopathy.  LUNGS:  Clear without rales rhonchi or wheezes.  HEART:  Regular rhythm S1-S2, without murmur.  ABDOMEN: Soft and flat with hernia is currently reduced.  CLINICAL DATA: Recent lab work reviewed  Assessment/Plan Bilateral inguinal hernias. I discussed the plan for bilateral inguinal hernia repair  Renda RollsWilton  Juaquina Machnik, MD 02/12/2016, 7:27 AM

## 2016-02-26 ENCOUNTER — Encounter: Payer: Self-pay | Admitting: Surgery

## 2016-12-27 ENCOUNTER — Ambulatory Visit: Payer: BLUE CROSS/BLUE SHIELD | Attending: Otolaryngology

## 2017-11-15 IMAGING — CT CT ABD-PELV W/ CM
2 of 4 series · 16 of 46 positions shown, 18 images · IV contrast (iopamidol)
Comparison: 05/23/2008

CLINICAL DATA: Left lower quadrant abdominal pain for 1 week,
worsening the last 2 days.

EXAM:
CT ABDOMEN AND PELVIS WITH CONTRAST
TECHNIQUE: Multidetector CT imaging of the abdomen and pelvis was performed
using the standard protocol following bolus administration of
intravenous contrast.
CONTRAST:  100mL F3RZQH-ILL IOPAMIDOL (F3RZQH-ILL) INJECTION 61%

[Series 2: routine abd pel with · axial · 0.75mm/px · z∈[-521,-81]mm · 13 of 98 slices shown, 15 images]
[im 5/98  soft-tissue]
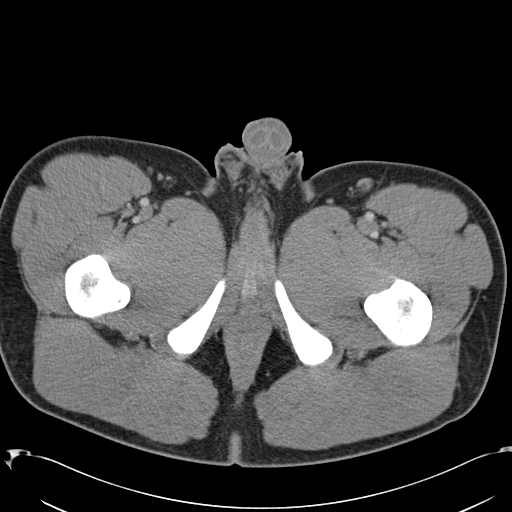
[im 5/98  bone]
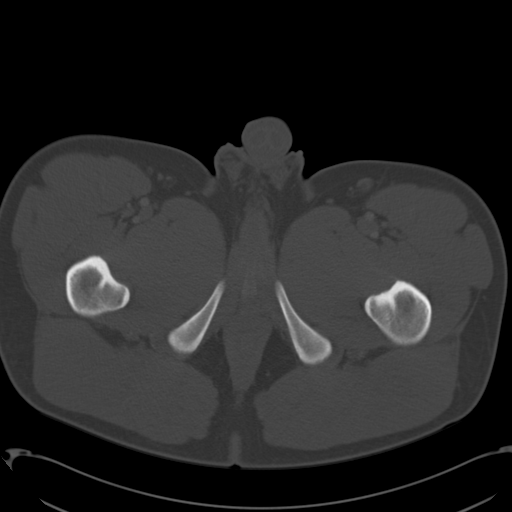
[im 13/98  soft-tissue]
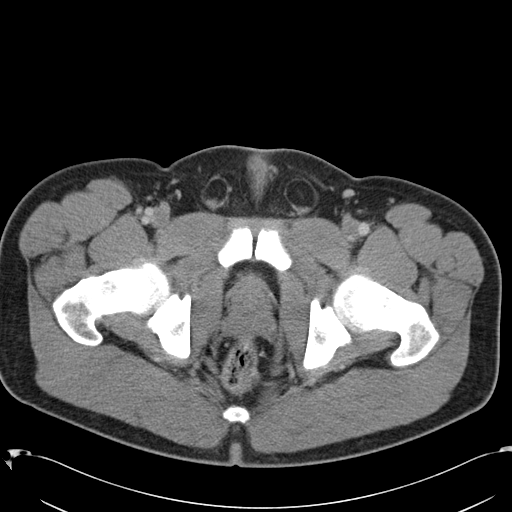
[im 21/98  soft-tissue]
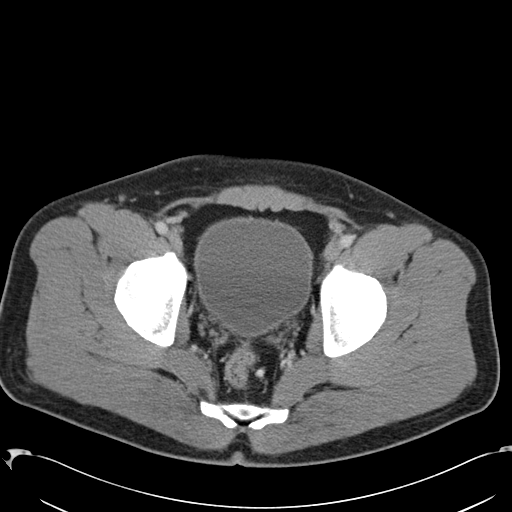
[im 29/98  soft-tissue]
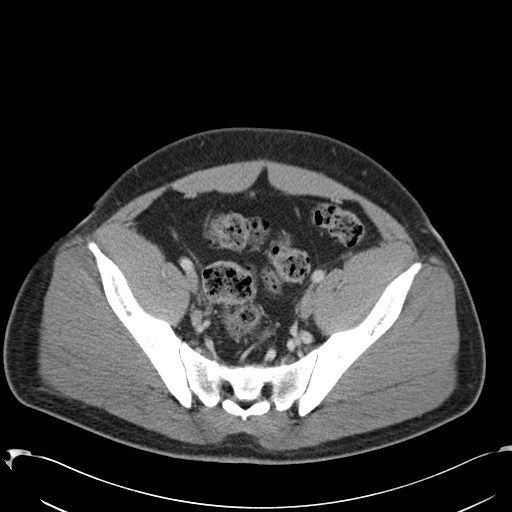
[im 33/98  soft-tissue]
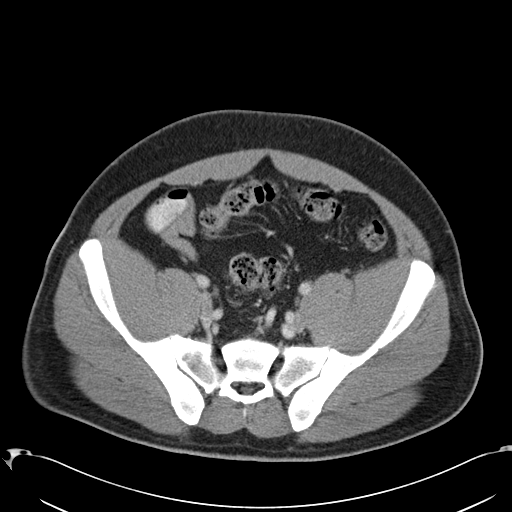
[im 41/98  soft-tissue]
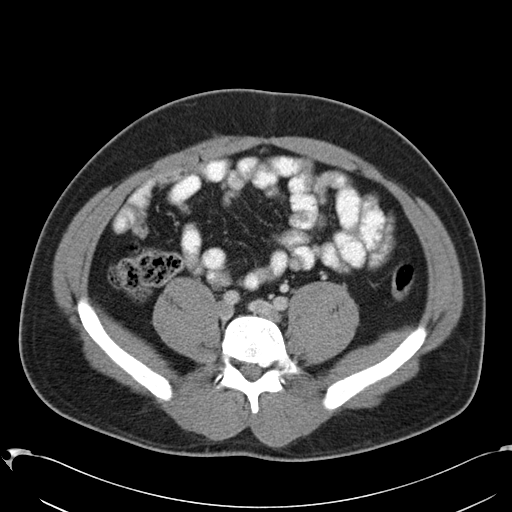
[im 49/98  soft-tissue]
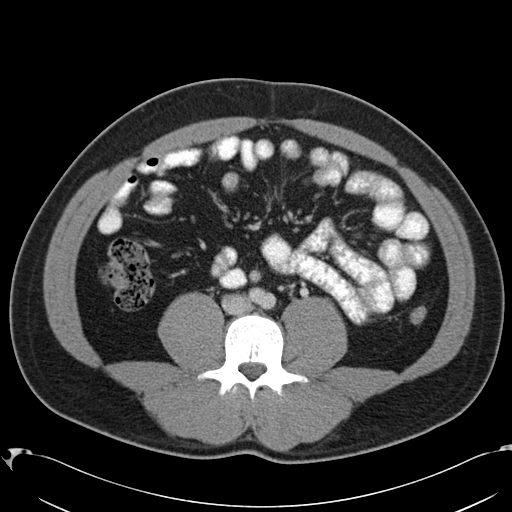
[im 57/98  soft-tissue]
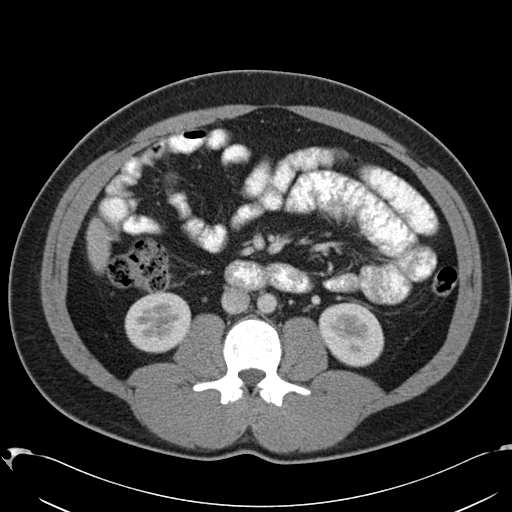
[im 65/98  soft-tissue]
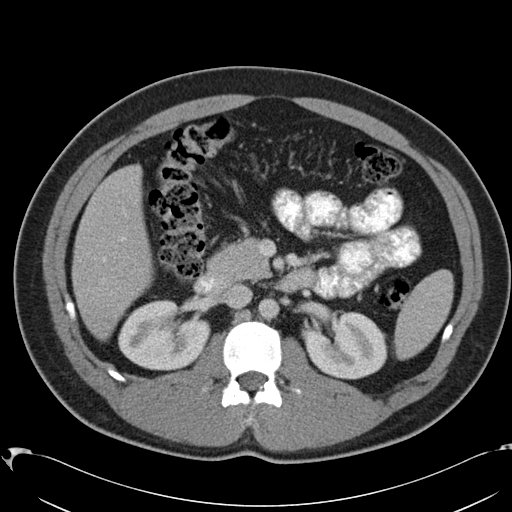
[im 65/98  bone]
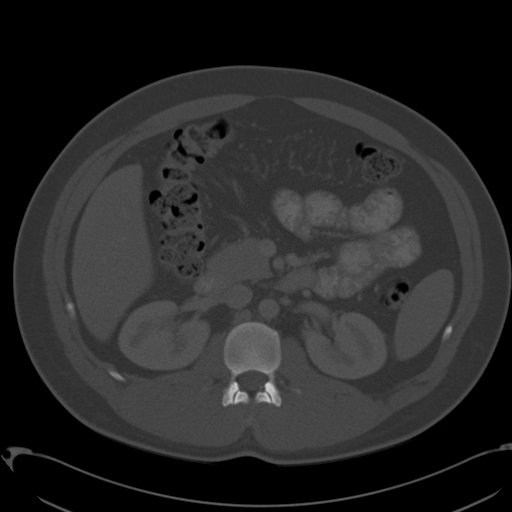
[im 69/98  soft-tissue]
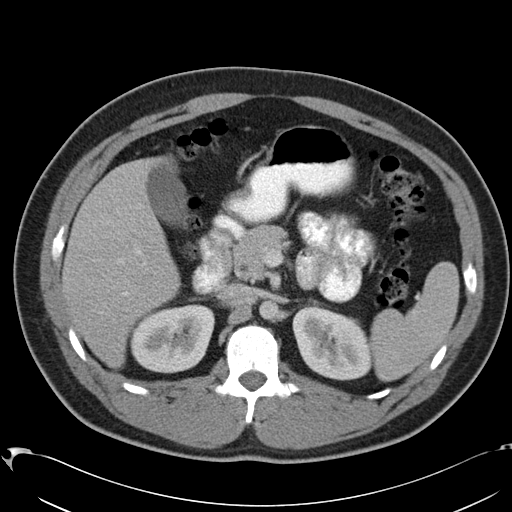
[im 77/98  soft-tissue]
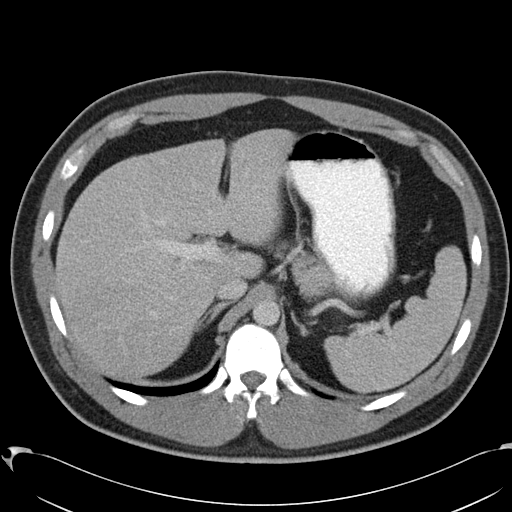
[im 85/98  soft-tissue]
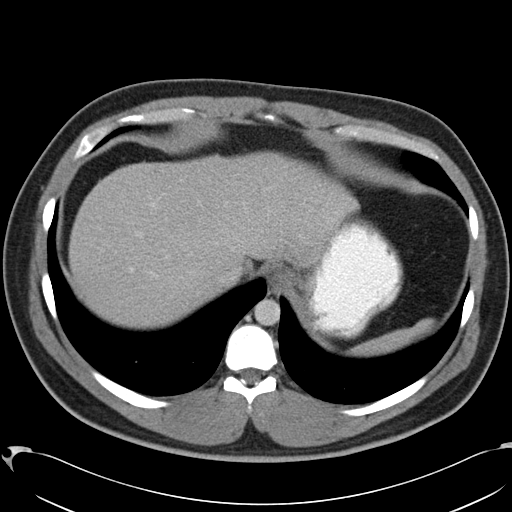
[im 93/98  soft-tissue]
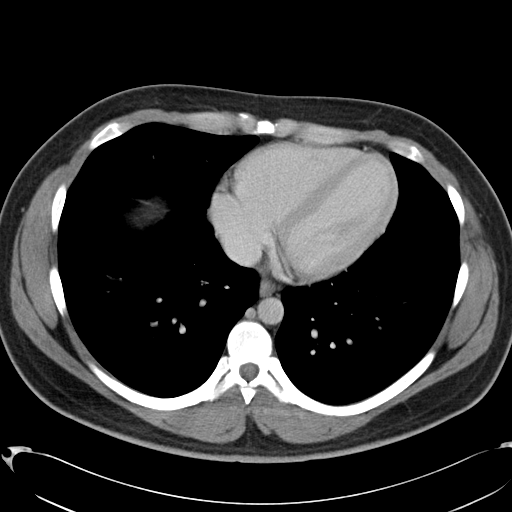

[Series 5: cor routine abd pel with · coronal · 0.73mm/px · 3 of 143 slices shown]
[im 48/143  soft-tissue]
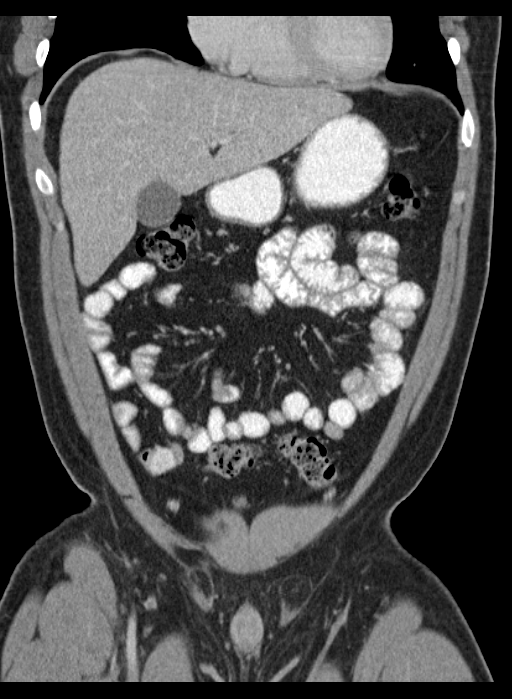
[im 64/143  soft-tissue]
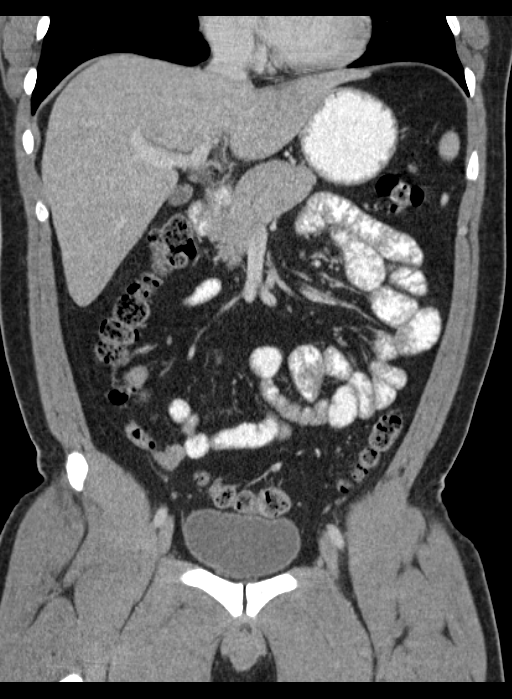
[im 79/143  soft-tissue]
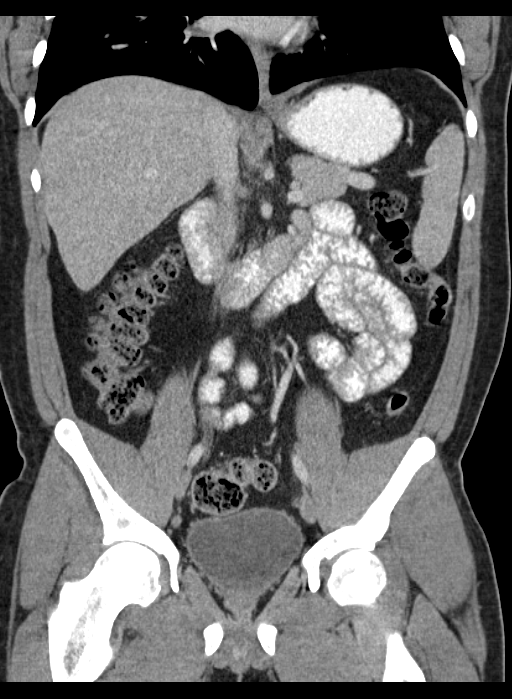

[16 of 46 positions shown; findings below may reference images not displayed]

FINDINGS: Lower chest: Lung bases are clear. No effusions. Heart is normal
size.

Hepatobiliary: No focal hepatic abnormality. Gallbladder
unremarkable.

Pancreas: No focal abnormality or ductal dilatation.

Spleen: No focal abnormality.  Normal size.

Adrenals/Urinary Tract: No adrenal abnormality. No focal renal
abnormality. No stones or hydronephrosis. Urinary bladder is
unremarkable.

Stomach/Bowel: Stomach, large and small bowel grossly unremarkable.

Vascular/Lymphatic: No evidence of aneurysm or adenopathy.

Reproductive: No visible abnormality

Other: No free fluid or free air. Small bilateral inguinal hernias
containing fat.

Musculoskeletal: No acute bony abnormality or focal bone lesion.
IMPRESSION: No acute findings in the abdomen or pelvis.

Small bilateral inguinal hernias containing fat.

## 2018-05-19 ENCOUNTER — Encounter (HOSPITAL_COMMUNITY): Payer: Self-pay

## 2018-05-19 ENCOUNTER — Emergency Department (HOSPITAL_COMMUNITY)
Admission: EM | Admit: 2018-05-19 | Discharge: 2018-05-19 | Disposition: A | Discharge status: Home / Self Care | Payer: BLUE CROSS/BLUE SHIELD | Attending: Emergency Medicine | Admitting: Emergency Medicine

## 2018-05-19 ENCOUNTER — Other Ambulatory Visit: Payer: Self-pay

## 2018-05-19 ENCOUNTER — Emergency Department (HOSPITAL_COMMUNITY): Payer: BLUE CROSS/BLUE SHIELD

## 2018-05-19 ENCOUNTER — Ambulatory Visit (HOSPITAL_COMMUNITY)
Admission: EM | Admit: 2018-05-19 | Discharge: 2018-05-19 | Disposition: A | Discharge status: Home / Self Care | Payer: BLUE CROSS/BLUE SHIELD | Source: Home / Self Care

## 2018-05-19 DIAGNOSIS — Y9389 Activity, other specified: Secondary | ICD-10-CM | POA: Insufficient documentation

## 2018-05-19 DIAGNOSIS — Z23 Encounter for immunization: Secondary | ICD-10-CM | POA: Diagnosis not present

## 2018-05-19 DIAGNOSIS — Y92009 Unspecified place in unspecified non-institutional (private) residence as the place of occurrence of the external cause: Secondary | ICD-10-CM | POA: Insufficient documentation

## 2018-05-19 DIAGNOSIS — S61211A Laceration without foreign body of left index finger without damage to nail, initial encounter: Secondary | ICD-10-CM | POA: Diagnosis present

## 2018-05-19 DIAGNOSIS — Y999 Unspecified external cause status: Secondary | ICD-10-CM | POA: Insufficient documentation

## 2018-05-19 DIAGNOSIS — W260XXA Contact with knife, initial encounter: Secondary | ICD-10-CM | POA: Diagnosis not present

## 2018-05-19 DIAGNOSIS — S61311A Laceration without foreign body of left index finger with damage to nail, initial encounter: Secondary | ICD-10-CM

## 2018-05-19 MED ORDER — TETANUS-DIPHTH-ACELL PERTUSSIS 5-2.5-18.5 LF-MCG/0.5 IM SUSP
0.5000 mL | Freq: Once | INTRAMUSCULAR | Status: AC
  Administered 2018-05-19: 0.5 mL via INTRAMUSCULAR
  Filled 2018-05-19: qty 0.5

## 2018-05-19 MED ORDER — LIDOCAINE HCL (PF) 1 % IJ SOLN
5.0000 mL | Freq: Once | INTRAMUSCULAR | Status: AC
  Administered 2018-05-19: 5 mL
  Filled 2018-05-19: qty 5

## 2018-05-19 NOTE — ED Notes (Signed)
ED Provider at bedside. 

## 2018-05-19 NOTE — Discharge Instructions (Addendum)
Keep wound clean and dry. Wear splint to protect tip of finger. Wound check in 2 days, suture removal in 10-12 days.

## 2018-05-19 NOTE — ED Notes (Signed)
Patient transported to X-ray 

## 2018-05-19 NOTE — ED Triage Notes (Signed)
Pt has laceration to left index finger from a knife while putting down flooring. Last tetanus unknown. Moderate bleeding, laceration is thru the nail, tip of finger still intact. +ROM and sensation

## 2018-05-19 NOTE — ED Triage Notes (Signed)
Pt presence to triage with left  finger laceration to his index finger. Pt was escorted to the ER by RN. Pt bleeding is controlled.

## 2018-05-19 NOTE — ED Provider Notes (Signed)
[facility] Provider Note   CSN: [id number] Arrival date & time: 05/19/18  1639     History   Chief Complaint Chief Complaint  Patient presents with  . Finger Injury    HPI Andre Ferrell is a 32 y.o. male.  32 year old male presents with laceration to the left index finger.  Patient was installing flooring at home when he accidentally cut his finger with a razor blade.  Patient is right-hand dominant, not on blood thinners, last tetanus unknown.  Bleeding controlled prior to arrival with pressure.  No other injuries, complaints, concerns.     Past Medical History:  Diagnosis Date  . Elevated liver enzymes   . GERD (gastroesophageal reflux disease)    NO MEDS    There are no active problems to display for this patient.   Past Surgical History:  Procedure Laterality Date  . APPENDECTOMY    . INGUINAL HERNIA REPAIR Bilateral 02/12/2016   Procedure: HERNIA REPAIR INGUINAL ADULT;  Surgeon: Nadeen LandauJarvis Wilton Smith, MD;  Location: [facility] ORS;  Service: General;  Laterality: Bilateral;        Home Medications    Prior to Admission medications   Medication Sig Start Date End Date Taking? Authorizing Provider  HYDROcodone-acetaminophen (NORCO) 5-325 MG tablet Take 1-2 tablets by mouth every 4 (four) hours as needed for moderate pain. 02/12/16   Nadeen LandauSmith, Jarvis Wilton, MD  ibuprofen (ADVIL,MOTRIN) 200 MG tablet Take 200 mg by mouth every 6 (six) hours as needed.    [provider]    Family History No family history on file.  Social History Social History   Tobacco Use  . Smoking status: Never Smoker  . Smokeless tobacco: Never Used  Substance Use Topics  . Alcohol use: Yes    Comment: OCC  . Drug use: No     Allergies   Patient has no known allergies.   Review of Systems Review of Systems  Constitutional: Negative for fever.  Musculoskeletal: Positive for myalgias. Negative for arthralgias and joint swelling.    Skin: Negative for color change, rash and wound.  Allergic/Immunologic: Negative for immunocompromised state.  Neurological: Negative for weakness and numbness.  Hematological: Does not bruise/bleed easily.  Psychiatric/Behavioral: Negative for self-injury.  All other systems reviewed and are negative.    Physical Exam Updated Vital Signs BP (!) 128/92 (BP Location: Right Arm)   Pulse 98   Temp 97.9 F (36.6 C) (Oral)   Resp 18   Ht 5\' 6"  (1.676 m)   Wt 99.8 kg   SpO2 98%   BMI 35.51 kg/m   Physical Exam  Musculoskeletal:       Hands:    ED Treatments / Results  Labs (all labs ordered are listed, but only abnormal results are displayed) Labs Reviewed - No data to display  EKG None  Radiology Dg Finger Index Left  Result Date: 05/19/2018 CLINICAL DATA:  Left index finger laceration. EXAM: LEFT INDEX FINGER 2+V COMPARISON:  None FINDINGS: Laceration to the tip of the left index finger soft tissues. No acute bony abnormality. Specifically, no fracture, subluxation, or dislocation. No radiopaque foreign body. IMPRESSION: No acute bony abnormality. Electronically Signed   By: Charlett NoseKevin  Dover M.D.   On: 05/19/2018 17:15    Procedures .Marland Kitchen.Laceration Repair Date/Time: 05/19/2018 5:58 PM Performed by: Jeannie FendMurphy,  A, PA-C Authorized by: Jeannie FendMurphy,  A, PA-C   Consent:    Consent obtained:  Verbal   Consent given by:  Patient   Risks  discussed:  Infection, need for additional repair, pain, poor cosmetic result and poor wound healing   Alternatives discussed:  No treatment and delayed treatment Universal protocol:    Procedure explained and questions answered to patient or proxy's satisfaction: yes     Imaging studies available: yes     Patient identity confirmed:  Verbally with patient Anesthesia (see MAR for exact dosages):    Anesthesia method:  Nerve block   Block location:  Digital block   Block needle gauge:  27 G   Block anesthetic:  Lidocaine 1% w/o epi    Block technique:  Digital block   Block injection procedure:  Anatomic landmarks palpated, anatomic landmarks identified, introduced needle, negative aspiration for blood and incremental injection   Block outcome:  Anesthesia achieved Laceration details:    Location:  Finger   Finger location:  L index finger   Length (cm):  1.5   Depth (mm):  3 Repair type:    Repair type:  Simple Pre-procedure details:    Preparation:  Patient was prepped and draped in usual sterile fashion and imaging obtained to evaluate for foreign bodies Exploration:    Hemostasis achieved with:  Tourniquet   Wound exploration: wound explored through full range of motion and entire depth of wound probed and visualized     Wound extent: no muscle damage noted, no tendon damage noted, no underlying fracture noted and no vascular damage noted     Contaminated: no   Treatment:    Area cleansed with:  Saline   Amount of cleaning:  Standard   Irrigation solution:  Sterile saline Skin repair:    Repair method:  Sutures   Suture size:  4-0   Suture material:  Nylon   Suture technique:  Simple interrupted   Number of sutures:  4 Approximation:    Approximation:  Close Post-procedure details:    Dressing:  Splint for protection and bulky dressing   Patient tolerance of procedure:  Tolerated well, no immediate complications   (including critical care time)  Medications Ordered in ED Medications  lidocaine (PF) (XYLOCAINE) 1 % injection 5 mL (5 mLs Infiltration Given 05/19/18 1651)  Tdap (BOOSTRIX) injection 0.5 mL (0.5 mLs Intramuscular Given 05/19/18 1652)     Initial Impression / Assessment and Plan / ED Course  I have reviewed the triage vital signs and the nursing notes.  Pertinent labs & imaging results that were available during my care of the patient were reviewed by me and considered in my medical decision making (see chart for details).  Clinical Course as of May 19 1799  Sat May 19, 2018  1755 32  yo male with left index finger laceration. Tdap updated, xr negative for tuft fracture. Wound closed with sutures, advised patient of possible wound problems including necrosis of the small flap. Patient is not a diabetic, does not smoke. Recommend splint to protect wound, wound check in 2 days, suture removal in 10-12 days.   [LM]    Clinical Course User Index [LM] Jeannie Fend, PA-C   Final Clinical Impressions(s) / ED Diagnoses   Final diagnoses:  Laceration of left index finger without foreign body with damage to nail, initial encounter    ED Discharge Orders    None       Jeannie Fend, PA-C 05/19/18 1800    Tilden Fossa, MD 05/20/18 1053

## 2019-01-24 ENCOUNTER — Other Ambulatory Visit: Payer: Self-pay | Admitting: Internal Medicine

## 2019-01-24 DIAGNOSIS — Z20822 Contact with and (suspected) exposure to covid-19: Secondary | ICD-10-CM

## 2019-01-27 LAB — NOVEL CORONAVIRUS, NAA: SARS-CoV-2, NAA: NOT DETECTED

## 2020-05-23 IMAGING — CR DG FINGER INDEX 2+V*L*
3 series · 3 of 3 positions shown · non-contrast
Comparison: None

CLINICAL DATA: Left index finger laceration.

EXAM:
LEFT INDEX FINGER 2+V

[finger ap]
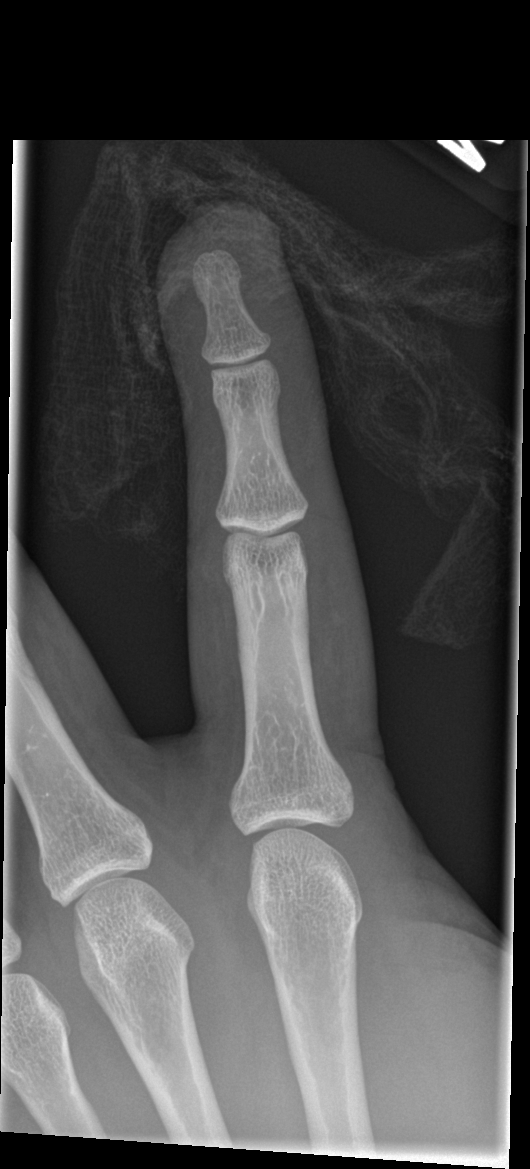

[finger obl]
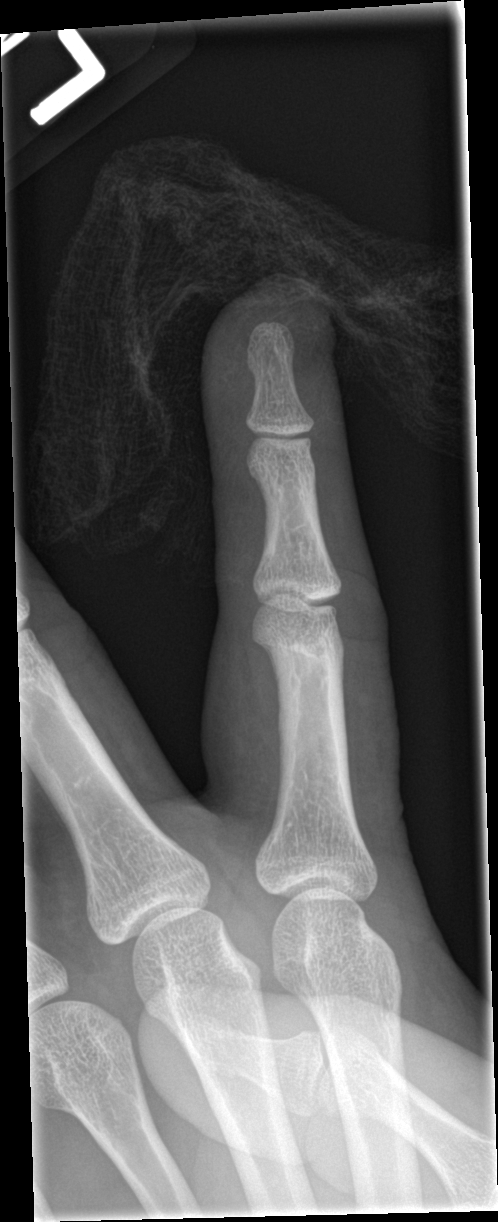

[finger lat]
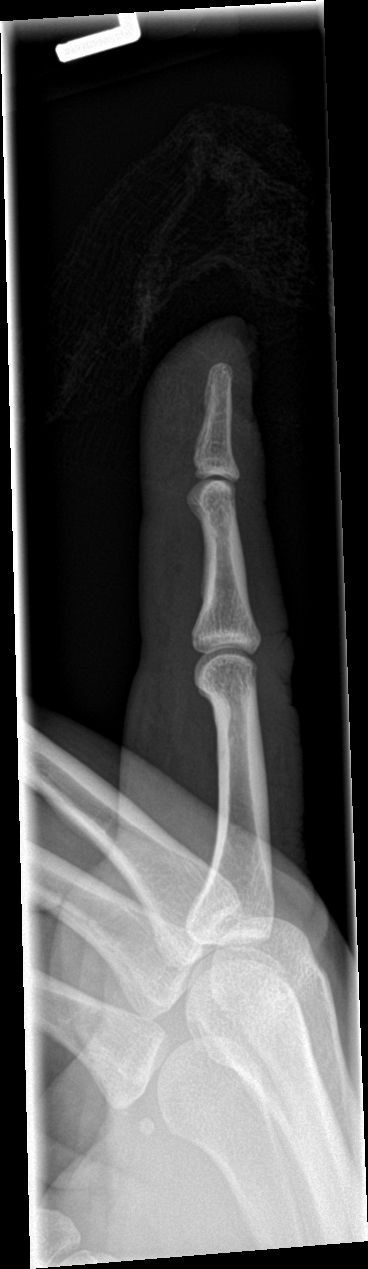

[3 of 3 positions shown; findings below may reference images not displayed]

FINDINGS: Laceration to the tip of the left index finger soft tissues. No
acute bony abnormality. Specifically, no fracture, subluxation, or
dislocation. No radiopaque foreign body.
IMPRESSION: No acute bony abnormality.
